# Patient Record
Sex: Male | Born: 1990 | Race: Black or African American | Hispanic: No | Marital: Single | Smoking: Current every day smoker
Health system: Southern US, Community
[De-identification: ages and names within clinical notes are randomized; demographics above are authoritative.]

---

## 2017-12-03 ENCOUNTER — Encounter (HOSPITAL_COMMUNITY): Payer: Self-pay

## 2017-12-03 ENCOUNTER — Other Ambulatory Visit: Payer: Self-pay

## 2017-12-03 ENCOUNTER — Emergency Department (HOSPITAL_COMMUNITY)
Admission: EM | Admit: 2017-12-03 | Discharge: 2017-12-03 | Disposition: A | Discharge status: Home / Self Care | Payer: Medicaid Other | Attending: Emergency Medicine | Admitting: Emergency Medicine

## 2017-12-03 ENCOUNTER — Emergency Department (HOSPITAL_COMMUNITY): Payer: Medicaid Other

## 2017-12-03 DIAGNOSIS — R1013 Epigastric pain: Secondary | ICD-10-CM | POA: Diagnosis not present

## 2017-12-03 DIAGNOSIS — Z79899 Other long term (current) drug therapy: Secondary | ICD-10-CM | POA: Insufficient documentation

## 2017-12-03 DIAGNOSIS — F17200 Nicotine dependence, unspecified, uncomplicated: Secondary | ICD-10-CM | POA: Insufficient documentation

## 2017-12-03 DIAGNOSIS — R079 Chest pain, unspecified: Secondary | ICD-10-CM | POA: Diagnosis not present

## 2017-12-03 LAB — BASIC METABOLIC PANEL
Anion gap: 8 (ref 5–15)
BUN: 9 mg/dL (ref 6–20)
CO2: 26 mmol/L (ref 22–32)
CREATININE: 1.03 mg/dL (ref 0.61–1.24)
Calcium: 9.4 mg/dL (ref 8.9–10.3)
Chloride: 105 mmol/L (ref 101–111)
GFR calc Af Amer: >60 mL/min (ref >60)
GLUCOSE: 115 mg/dL — AB (ref 65–99)
Potassium: 4 mmol/L (ref 3.5–5.1)
SODIUM: 139 mmol/L (ref 135–145)

## 2017-12-03 LAB — CBC
HCT: 46.3 % (ref 39.0–52.0)
Hemoglobin: 15.5 g/dL (ref 13.0–17.0)
MCH: 29.8 pg (ref 26.0–34.0)
MCHC: 33.5 g/dL (ref 30.0–36.0)
MCV: 89 fL (ref 78.0–100.0)
PLATELETS: 307 10*3/uL (ref 150–400)
RBC: 5.2 MIL/uL (ref 4.22–5.81)
RDW: 12 % (ref 11.5–15.5)
WBC: 11.5 10*3/uL — AB (ref 4.0–10.5)

## 2017-12-03 LAB — I-STAT TROPONIN, ED: Troponin i, poc: 0 ng/mL (ref 0.00–0.08)

## 2017-12-03 MED ORDER — RANITIDINE HCL 150 MG PO TABS: 150.0000 mg | ORAL_TABLET | Freq: Two times a day (BID) | ORAL | 0 refills | Status: AC

## 2017-12-03 NOTE — ED Triage Notes (Signed)
Pt states he is having heartburn for a month states if he drank milk it would get better. States now he is having a constant mild chest pain X1 month. Pt vague with answers in triage, hard to obtain accurate history.

## 2017-12-03 NOTE — ED Provider Notes (Signed)
[facility] EMERGENCY DEPARTMENT Provider Note   CSN: [id number] Arrival date & time: 12/03/17  1733     History   Chief Complaint Chief Complaint  Patient presents with  . Chest Pain    HPI Yasin Cornell Jermone Geister. is a 27 y.o. male.  The history is provided by the patient.  Abdominal Pain   This is a new problem. The current episode started more than 1 week ago. The problem occurs constantly. The problem has not changed since onset.The pain is associated with an unknown factor. The pain is located in the epigastric region. The quality of the pain is burning. The pain is mild. Pertinent negatives include fever, nausea, vomiting, dysuria, hematuria and arthralgias. Nothing aggravates the symptoms. Relieved by: drinking milk.    History reviewed. No pertinent past medical history.  There are no active problems to display for this patient.   History reviewed. No pertinent surgical history.      Home Medications    Prior to Admission medications   Medication Sig Start Date End Date Taking? Authorizing Provider  acetaminophen (TYLENOL) 500 MG tablet Take 500 mg by mouth every 6 (six) hours as needed for headache.   Yes [provider]  ranitidine (ZANTAC) 150 MG tablet Take 1 tablet (150 mg total) by mouth 2 (two) times daily. 12/03/17   Lennette Bihari, MD    Family History No family history on file.  Social History Social History   Tobacco Use  . Smoking status: Current Every Day Smoker  . Smokeless tobacco: Never Used  Substance Use Topics  . Alcohol use: Yes    Comment: once every 3 days   . Drug use: Yes    Types: Marijuana     Allergies   Patient has no known allergies.   Review of Systems Review of Systems  Constitutional: Negative for chills and fever.  HENT: Negative for ear pain and sore throat.   Eyes: Negative for pain and visual disturbance.  Respiratory: Negative for cough and shortness of breath.     Cardiovascular: Positive for chest pain. Negative for palpitations.  Gastrointestinal: Positive for abdominal pain. Negative for nausea and vomiting.  Genitourinary: Negative for dysuria and hematuria.  Musculoskeletal: Negative for arthralgias and back pain.  Skin: Negative for color change and rash.  Neurological: Negative for seizures and syncope.  All other systems reviewed and are negative.    Physical Exam Updated Vital Signs BP 119/72 (BP Location: Right Arm)   Pulse 77   Temp 98.2 F (36.8 C) (Oral)   Resp 16   Ht  (1.702 m)   Wt 95.3 kg (210 lb)   SpO2 100%   BMI 32.89 kg/m   Physical Exam  Constitutional: He is oriented to person, place, and time. He appears well-developed and well-nourished.  HENT:  Head: Normocephalic and atraumatic.  Eyes: Conjunctivae are normal.  Neck: Neck supple.  Cardiovascular: Normal rate and regular rhythm.  No murmur heard. Pulmonary/Chest: Effort normal and breath sounds normal. No respiratory distress.  Abdominal: Soft. He exhibits no distension. There is no tenderness. There is no rebound and no guarding.  Musculoskeletal: He exhibits no edema or tenderness.  Neurological: He is alert and oriented to person, place, and time.  Skin: Skin is warm and dry.  Psychiatric: He has a normal mood and affect.  Nursing note and vitals reviewed.    ED Treatments / Results  Labs (all labs ordered are listed, but only abnormal results  are displayed) Labs Reviewed  BASIC METABOLIC PANEL - Abnormal; Notable for the following components:      Result Value   Glucose, Bld 115 (*)    All other components within normal limits  CBC - Abnormal; Notable for the following components:   WBC 11.5 (*)    All other components within normal limits  I-STAT TROPONIN, ED    EKG None  Radiology Dg Chest 2 View  Result Date: 12/03/2017 CLINICAL DATA:  Chest pain EXAM: CHEST - 2 VIEW COMPARISON:  None. FINDINGS: Lungs are clear. Heart size and  pulmonary vascularity are normal. No adenopathy. There is upper thoracic levoscoliosis. No pneumothorax. No fracture evident. IMPRESSION: No edema or consolidation. Electronically Signed   By: Bretta Bang III M.D.   On: 12/03/2017 19:15    Procedures Procedures (including critical care time)  Medications Ordered in ED Medications - No data to display   Initial Impression / Assessment and Plan / ED Course  I have reviewed the triage vital signs and the nursing notes.  Pertinent labs & imaging results that were available during my care of the patient were reviewed by me and considered in my medical decision making (see chart for details).    Patient is a 27 year old male with no pertinent medical history who presents with several months of burning epigastric pain.  He has had associated chest pain for the last month.  He does report he has a diet mostly of fried food and often eats hot sauce.  He also reports that he has been drinking more alcohol than usual over the last several weeks as he has been stressed.  He has not had any nausea, vomiting, or shortness of breath.  He has no risk factors for ACS.  His EKG here is reassuring.  His chest x-ray is unremarkable.  Troponin is negative.  He had a mild leukocytosis.  I suspect peptic ulcer disease versus gastritis.  I counseled the patient on diet and reducing his alcohol consumption.  I will start him on Zantac.  I recommended he schedule a follow-up appointment with a primary care physician.  Return precautions were discussed in detail.  Patient discharged in stable condition.  Final Clinical Impressions(s) / ED Diagnoses   Final diagnoses:  Nonspecific chest pain    ED Discharge Orders        Ordered    ranitidine (ZANTAC) 150 MG tablet  2 times daily     12/03/17 2224       Lennette Bihari, MD 12/03/17 2241    Derwood Kaplan, MD 12/04/17 [phone]

## 2017-12-03 NOTE — ED Notes (Signed)
Patient left at this time with all belongings. 

## 2019-05-11 IMAGING — DX DG CHEST 2V
2 series · 2 of 2 positions shown · non-contrast
Comparison: None.

CLINICAL DATA: Chest pain

EXAM:
CHEST - 2 VIEW

[chest pa]
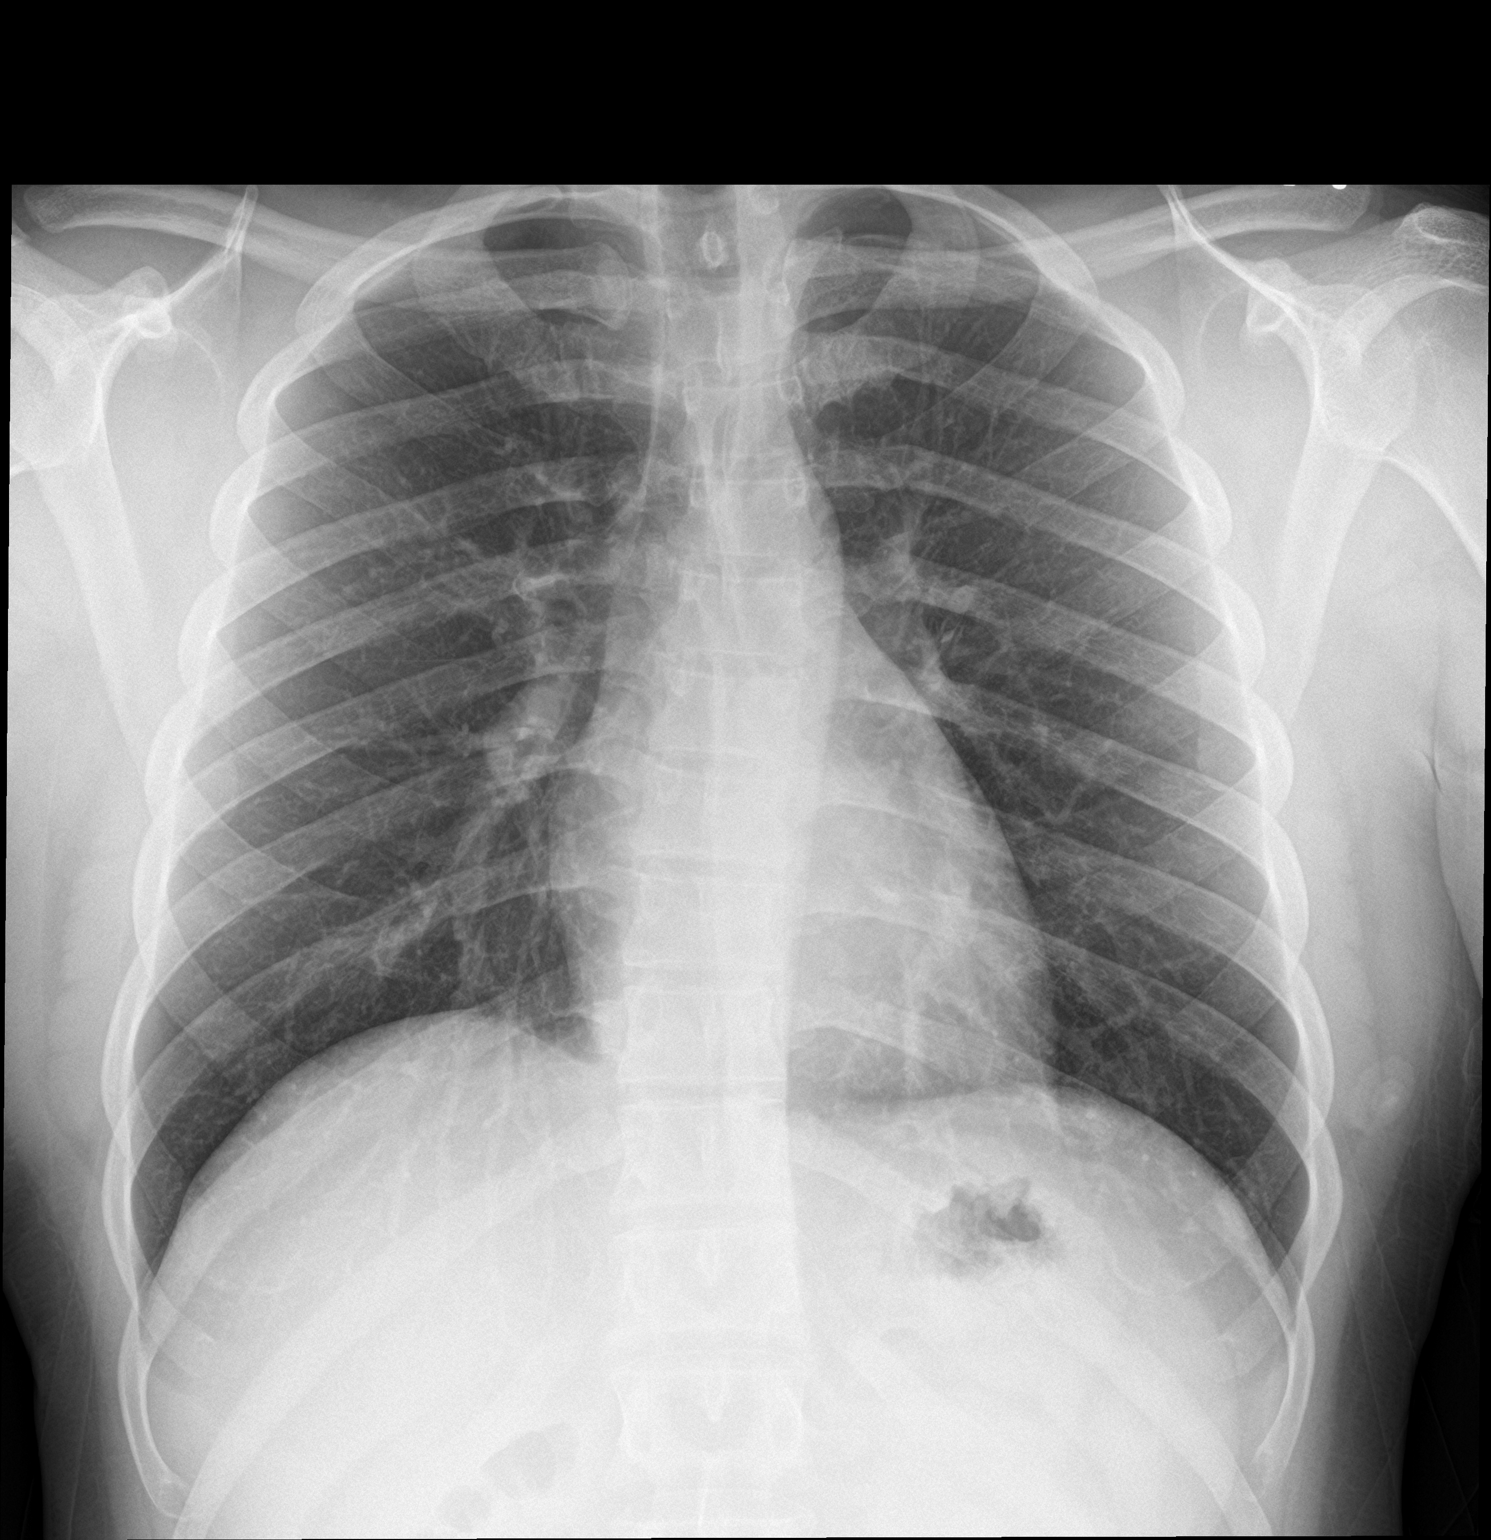

[chest lat]
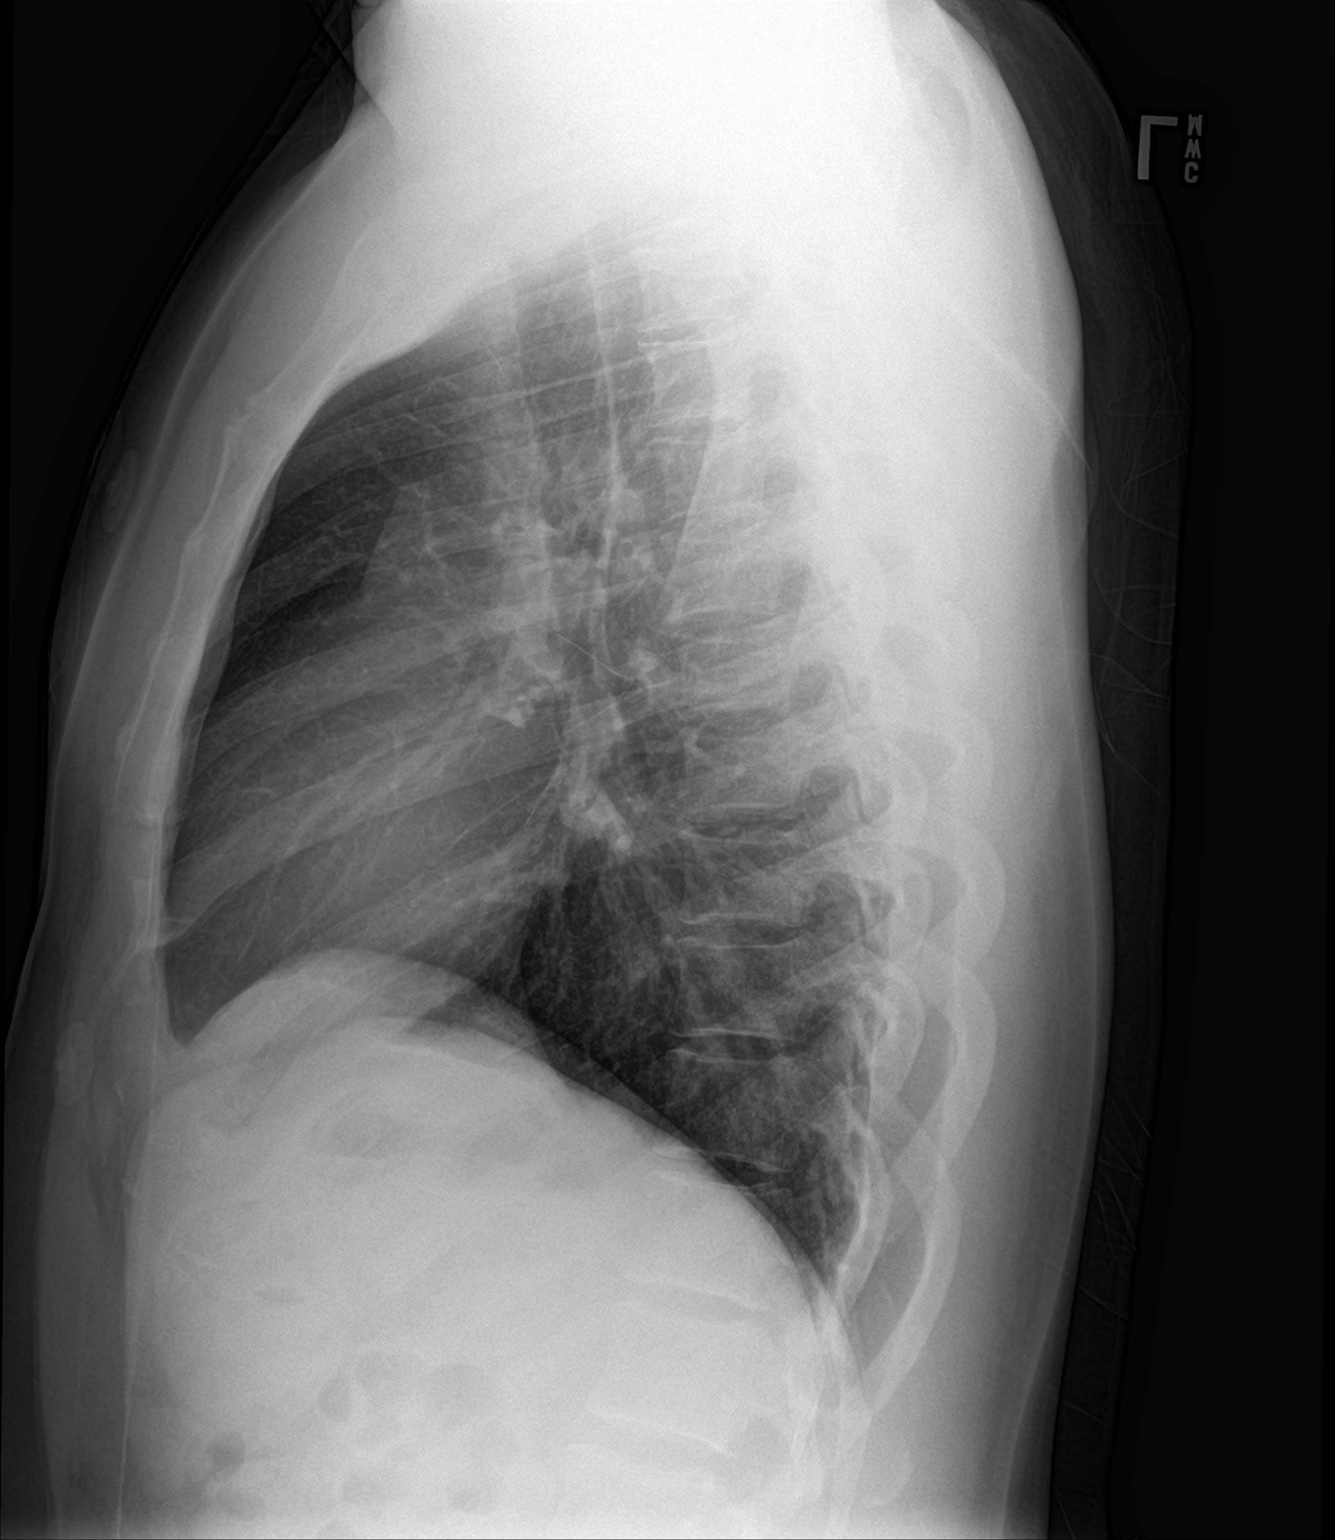

[2 of 2 positions shown; findings below may reference images not displayed]

FINDINGS: Lungs are clear. Heart size and pulmonary vascularity are normal. No
adenopathy. There is upper thoracic levoscoliosis. No pneumothorax.
No fracture evident.
IMPRESSION: No edema or consolidation.
